# Patient Record
Sex: Male | Born: 1980 | Hispanic: Yes | Marital: Married | Smoking: Current some day smoker
Health system: Southern US, Community
[De-identification: ages and names within clinical notes are randomized; demographics above are authoritative.]

## PROBLEM LIST (undated history)

## (undated) DIAGNOSIS — E119 Type 2 diabetes mellitus without complications: Secondary | ICD-10-CM

---

## 2011-04-06 ENCOUNTER — Emergency Department: Payer: Self-pay | Admitting: Emergency Medicine

## 2020-08-17 ENCOUNTER — Emergency Department: Payer: Worker's Compensation

## 2020-08-17 ENCOUNTER — Other Ambulatory Visit: Payer: Self-pay

## 2020-08-17 ENCOUNTER — Emergency Department
Admission: EM | Admit: 2020-08-17 | Discharge: 2020-08-17 | Disposition: A | Discharge status: Home / Self Care | Payer: Worker's Compensation | Attending: Emergency Medicine | Admitting: Emergency Medicine

## 2020-08-17 DIAGNOSIS — E119 Type 2 diabetes mellitus without complications: Secondary | ICD-10-CM | POA: Insufficient documentation

## 2020-08-17 DIAGNOSIS — S93602A Unspecified sprain of left foot, initial encounter: Secondary | ICD-10-CM | POA: Diagnosis not present

## 2020-08-17 DIAGNOSIS — S99922A Unspecified injury of left foot, initial encounter: Secondary | ICD-10-CM | POA: Diagnosis present

## 2020-08-17 DIAGNOSIS — Y990 Civilian activity done for income or pay: Secondary | ICD-10-CM | POA: Insufficient documentation

## 2020-08-17 DIAGNOSIS — F17200 Nicotine dependence, unspecified, uncomplicated: Secondary | ICD-10-CM | POA: Diagnosis not present

## 2020-08-17 DIAGNOSIS — W000XXA Fall on same level due to ice and snow, initial encounter: Secondary | ICD-10-CM | POA: Diagnosis not present

## 2020-08-17 HISTORY — DX: Type 2 diabetes mellitus without complications: E11.9

## 2020-08-17 LAB — CBG MONITORING, ED: Glucose-Capillary: 166 mg/dL — ABNORMAL HIGH (ref 70–99)

## 2020-08-17 MED ORDER — NAPROXEN 500 MG PO TABS: 500.0000 mg | ORAL_TABLET | Freq: Two times a day (BID) | ORAL | 0 refills | Status: AC

## 2020-08-17 MED ORDER — NAPROXEN 500 MG PO TABS
500.0000 mg | ORAL_TABLET | Freq: Once | ORAL | Status: AC
  Administered 2020-08-17: 500 mg via ORAL
  Filled 2020-08-17: qty 1

## 2020-08-17 MED ORDER — TRAMADOL HCL 50 MG PO TABS
50.0000 mg | ORAL_TABLET | Freq: Once | ORAL | Status: AC
  Administered 2020-08-17: 50 mg via ORAL
  Filled 2020-08-17: qty 1

## 2020-08-17 MED ORDER — TRAMADOL HCL 50 MG PO TABS: 50.0000 mg | ORAL_TABLET | Freq: Four times a day (QID) | ORAL | 0 refills | Status: AC | PRN

## 2020-08-17 NOTE — ED Provider Notes (Signed)
[facility] Emergency Department Provider Note   ____________________________________________   Event Date/Time   First MD Initiated Contact with Patient 08/17/20 1133     (approximate)  I have reviewed the triage vital signs and the nursing notes.   HISTORY  Chief Complaint Fall    HPI: Via interpreter Steven Mccullough is a 40 y.o. male patient complains of left lateral foot pain secondary to a slip and fall 3 days ago.  Patient is able to bear weight with difficulty.  Patient is wearing ankle splint.  Patient rates pain as 8/10.  Patient described pain as "achy".  No other palliative measure for complaint.         Past Medical History:  Diagnosis Date  . Diabetes mellitus without complication (HCC)     There are no problems to display for this patient.   History reviewed. No pertinent surgical history.  Prior to Admission medications   Medication Sig Start Date End Date Taking? Authorizing Provider  naproxen (NAPROSYN) 500 MG tablet Take 1 tablet (500 mg total) by mouth 2 (two) times daily with a meal. 08/17/20 08/17/21 Yes Joni Reining, PA-C  traMADol (ULTRAM) 50 MG tablet Take 1 tablet (50 mg total) by mouth every 6 (six) hours as needed for moderate pain. 08/17/20  Yes Joni Reining, PA-C    Allergies Patient has no allergy information on record.  No family history on file.  Social History Social History   Tobacco Use  . Smoking status: Current Some Day Smoker  . Smokeless tobacco: Never Used  Substance Use Topics  . Alcohol use: Never  . Drug use: Never    Review of Systems Constitutional: No fever/chills Eyes: No visual changes. ENT: No sore throat. Cardiovascular: Denies chest pain. Respiratory: Denies shortness of breath. Gastrointestinal: No abdominal pain.  No nausea, no vomiting.  No diarrhea.  No constipation. Genitourinary: Negative for dysuria. Musculoskeletal: Left foot pain. Skin: Negative for  rash. Neurological: Negative for headaches, focal weakness or numbness. Endocrine:  Diabetes and hypertension.  ____________________________________________   PHYSICAL EXAM:  VITAL SIGNS: ED Triage Vitals [08/17/20 1124]  Enc Vitals Group     BP (!) 145/103     Pulse Rate 72     Resp 18     Temp 98.1 F (36.7 C)     Temp src      SpO2 100 %     Weight      Height      Head Circumference      Peak Flow      Pain Score 8     Pain Loc      Pain Edu?      Excl. in GC?    Constitutional: Alert and oriented. Well appearing and in no acute distress. Eyes: Conjunctivae are normal. PERRL. EOMI. Head: Atraumatic. Nose: No congestion/rhinnorhea. Mouth/Throat: Mucous membranes are moist.  Oropharynx non-erythematous. Neck: No stridor.  No cervical spine tenderness to palpation. Hematological/Lymphatic/Immunilogical: No cervical lymphadenopathy. Cardiovascular: Normal rate, regular rhythm. Grossly normal heart sounds.  Good peripheral circulation.  Elevated blood pressure. Respiratory: Normal respiratory effort.  No retractions. Lungs CTAB. Gastrointestinal: Soft and nontender. No distention. No abdominal bruits. No CVA tenderness. Musculoskeletal: No obvious deformity to the left foot.  Moderate edema. Neurologic:  Normal speech and language. No gross focal neurologic deficits are appreciated. No gait instability. Skin:  Skin is warm, dry and intact. No rash noted. Psychiatric: Mood and affect are normal. Speech and behavior are normal.  ____________________________________________   LABS (all labs ordered are listed, but only abnormal results are displayed)  Labs Reviewed  CBG MONITORING, ED - Abnormal; Notable for the following components:      Result Value   Glucose-Capillary 166 (*)    All other components within normal limits   ____________________________________________  EKG   ____________________________________________  RADIOLOGY I, Joni Reining,  personally viewed and evaluated these images (plain radiographs) as part of my medical decision making, as well as reviewing the written report by the radiologist.  ED MD interpretation: No acute findings x-ray of the left foot.  Official radiology report(s): DG Foot Complete Left  Result Date: 08/17/2020 CLINICAL DATA:  Left foot pain after fall 3 days ago. EXAM: LEFT FOOT - COMPLETE 3+ VIEW COMPARISON:  None. FINDINGS: There is no evidence of fracture or dislocation. There is no evidence of arthropathy or other focal bone abnormality. Soft tissues are unremarkable. Congenitally short fourth metatarsal is noted. IMPRESSION: Negative. Electronically Signed   By: Lupita Raider M.D.   On: 08/17/2020 12:17    ____________________________________________   PROCEDURES  Procedure(s) performed (including Critical Care):  Procedures   ____________________________________________   INITIAL IMPRESSION / ASSESSMENT AND PLAN / ED COURSE  As part of my medical decision making, I reviewed the following data within the electronic MEDICAL RECORD NUMBER         Patient presents with left lateral foot pain secondary to a slip and fall 3 days ago.  Patient is able to weight-bear.  Discussed no acute findings x-ray of the left foot.  Patient complaint physical exam consistent with sprain of the left foot.  Patient foot was Ace wrapped and reapplied ankle splint.  Patient given discharge care instructions and prescription for naproxen.     ____________________________________________   FINAL CLINICAL IMPRESSION(S) / ED DIAGNOSES  Final diagnoses:  Sprain of left foot, initial encounter     ED Discharge Orders         Ordered    naproxen (NAPROSYN) 500 MG tablet  2 times daily with meals        08/17/20 1246    traMADol (ULTRAM) 50 MG tablet  Every 6 hours PRN        08/17/20 1246          *Please note:  Steven Mccullough was evaluated in Emergency Department on 08/17/2020 for the  symptoms described in the history of present illness. He was evaluated in the context of the global COVID-19 pandemic, which necessitated consideration that the patient might be at risk for infection with the SARS-CoV-2 virus that causes COVID-19. Institutional protocols and algorithms that pertain to the evaluation of patients at risk for COVID-19 are in a state of rapid change based on information released by regulatory bodies including the CDC and federal and state organizations. These policies and algorithms were followed during the patient's care in the ED.  Some ED evaluations and interventions may be delayed as a result of limited staffing during and the pandemic.*   Note:  This document was prepared using Dragon voice recognition software and may include unintentional dictation errors.    Joni Reining, PA-C 08/17/20 1248    Shaune Pollack, MD 08/18/20 [phone]

## 2020-08-17 NOTE — ED Notes (Addendum)
Per supervisor, drug test is not required for workers comp claim

## 2020-08-17 NOTE — ED Notes (Addendum)
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 Pt reports pain to ankle, swelling, pain with ambulation. Pt with visible swelling to left foot, mostly in forefoot. Color maintained. Can wiggle toes. Difficulty flexing/extending foot.   WORKERS COMP: Supervisor Reuel Boom [phone]  Pt alert and oriented x4. Interpreter used

## 2020-08-17 NOTE — Discharge Instructions (Signed)
X-ray was negative for fracture.  Follow discharge care instructions.  Wear Ace wrap and ankle support for 3 to 5 days.  Take medication as directed.

## 2020-08-17 NOTE — ED Triage Notes (Signed)
Pt comes via POV from home with c/o fall. Pt states he thinks his sugar is up.  Pt states he fall Monday on the ice. Pt states pain and left foot pain and swelling.

## 2021-10-06 IMAGING — DX DG FOOT COMPLETE 3+V*L*
3 series · 3 of 3 positions shown · non-contrast
Comparison: None.

CLINICAL DATA: Left foot pain after fall 3 days ago.

EXAM:
LEFT FOOT - COMPLETE 3+ VIEW

[foot ap]
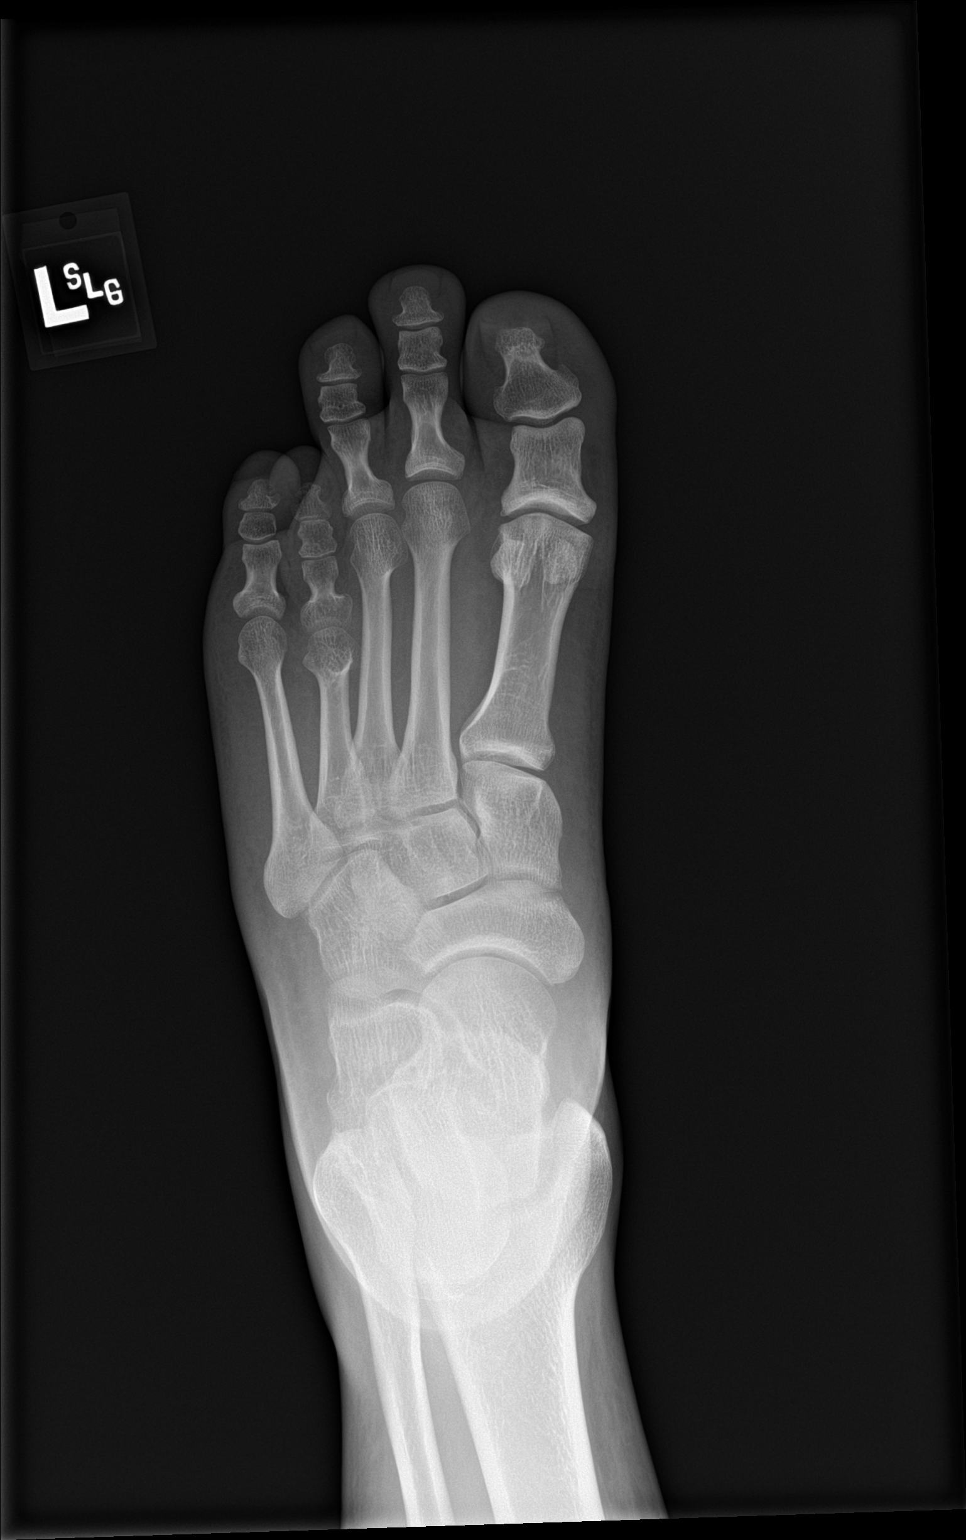

[foot obl]
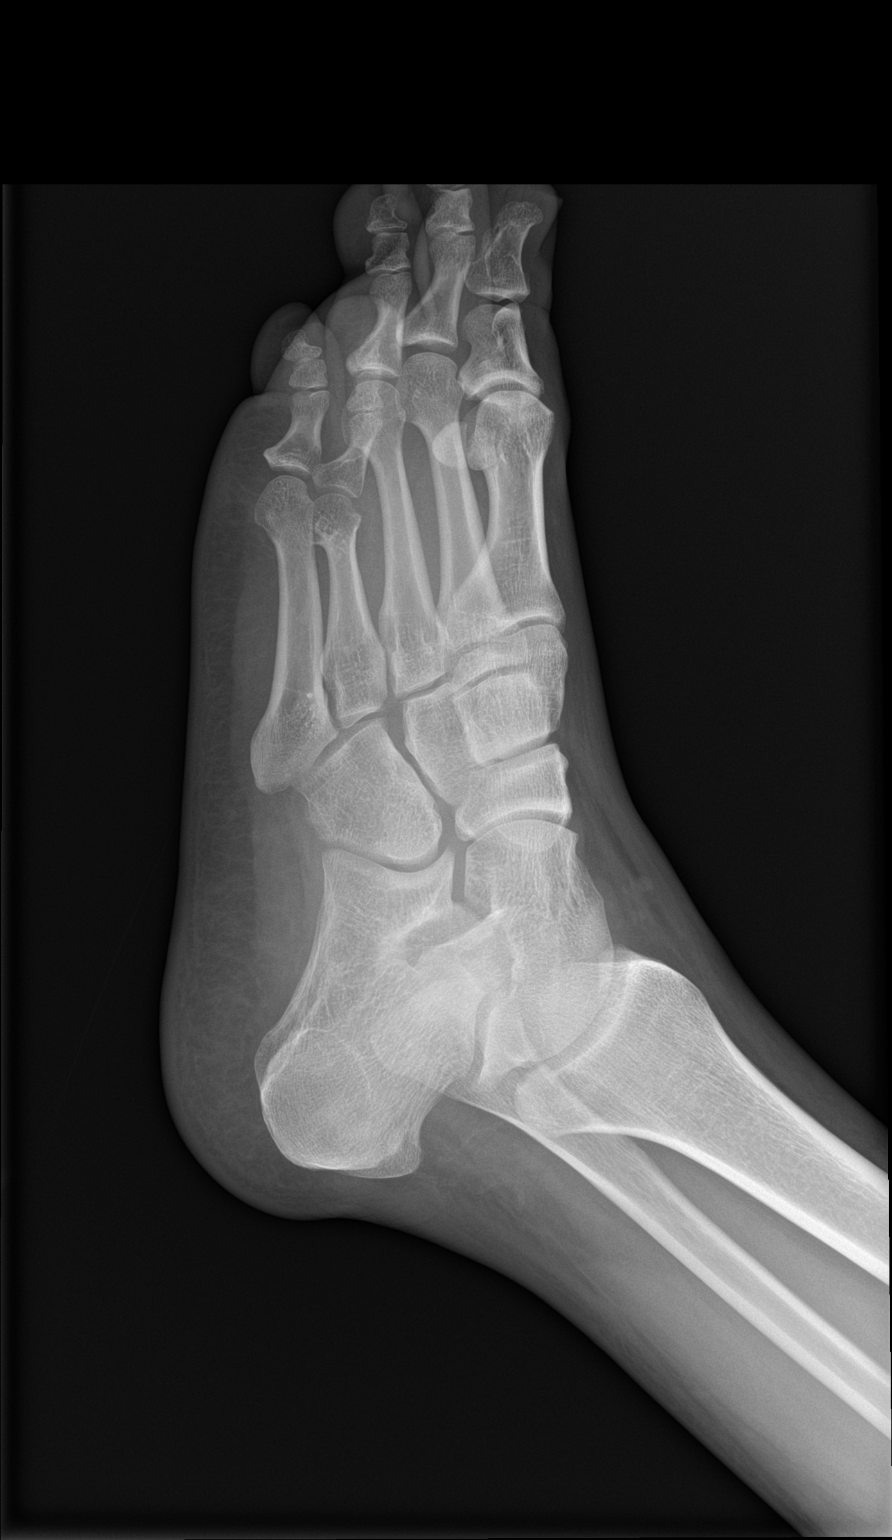

[foot lat]
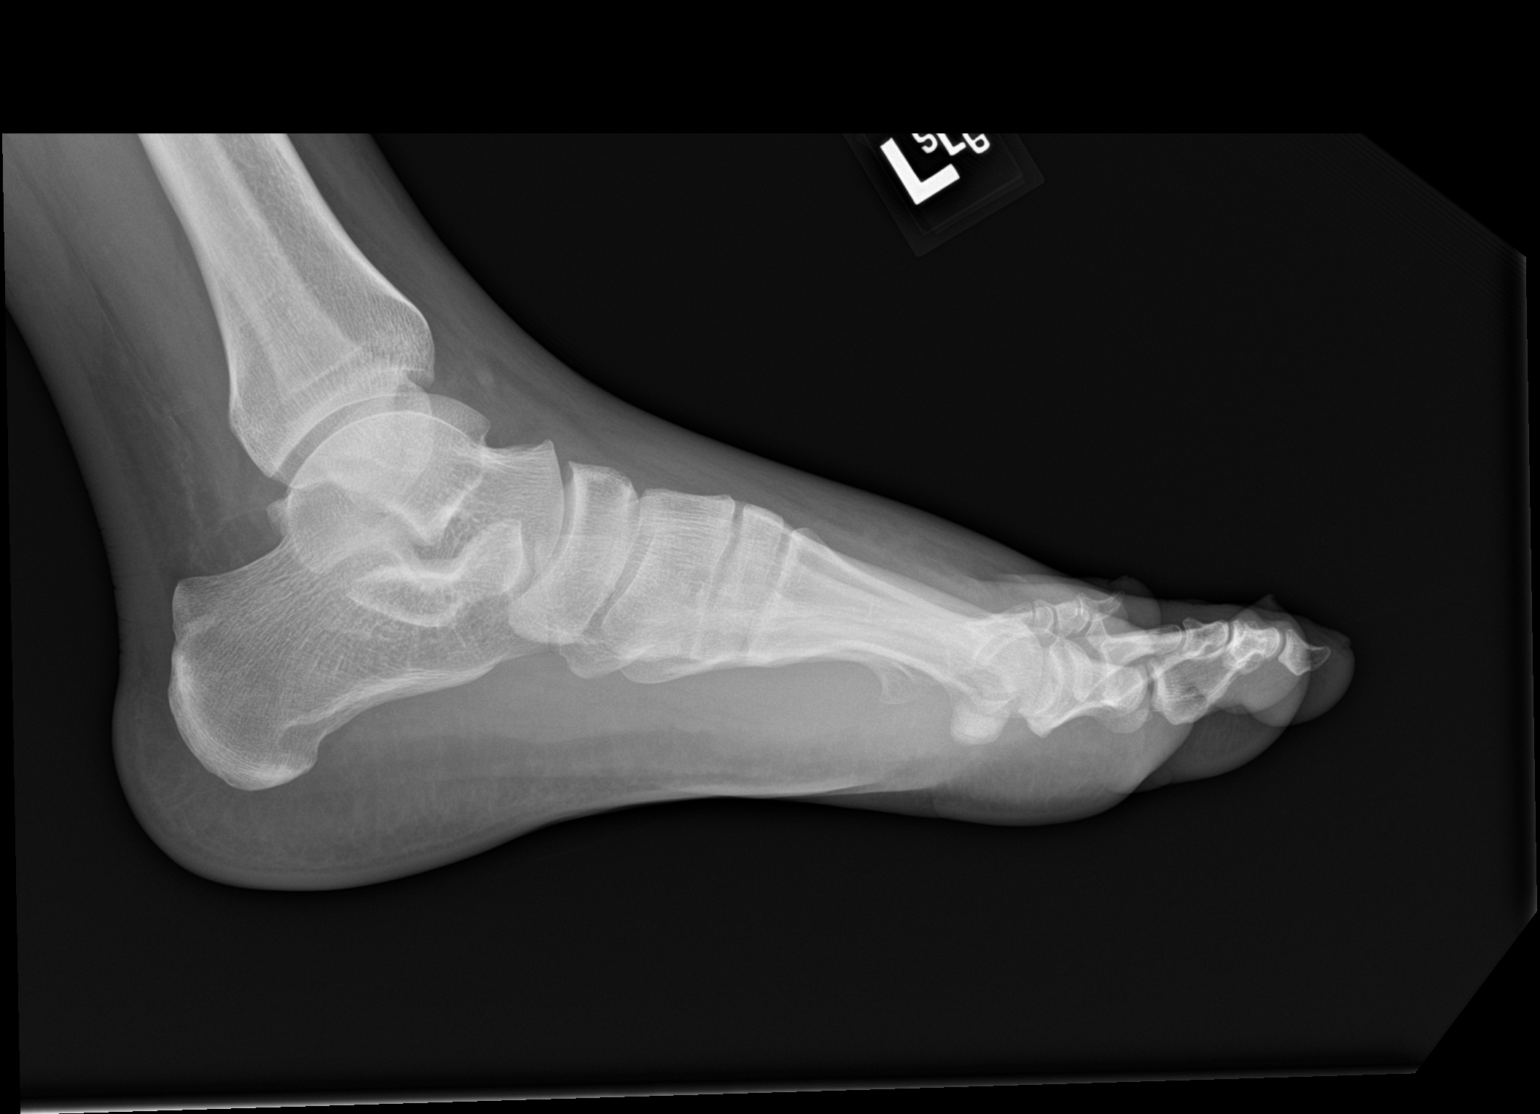

[3 of 3 positions shown; findings below may reference images not displayed]

FINDINGS: There is no evidence of fracture or dislocation. There is no
evidence of arthropathy or other focal bone abnormality. Soft
tissues are unremarkable. Congenitally short fourth metatarsal is
noted.
IMPRESSION: Negative.
